# Patient Record
Sex: Male | Born: 1950 | Race: White | Hispanic: No | Marital: Married | Smoking: Never smoker
Health system: Southern US, Community
[De-identification: ages and names within clinical notes are randomized; demographics above are authoritative.]

## PROBLEM LIST (undated history)

## (undated) DIAGNOSIS — E119 Type 2 diabetes mellitus without complications: Secondary | ICD-10-CM

## (undated) DIAGNOSIS — I10 Essential (primary) hypertension: Secondary | ICD-10-CM

## (undated) DIAGNOSIS — E7800 Pure hypercholesterolemia, unspecified: Secondary | ICD-10-CM

---

## 2006-07-26 ENCOUNTER — Inpatient Hospital Stay (HOSPITAL_COMMUNITY): Admission: EM | Admit: 2006-07-26 | Discharge: 2006-08-06 | Payer: Self-pay | Admitting: Emergency Medicine

## 2007-06-16 ENCOUNTER — Emergency Department (HOSPITAL_COMMUNITY): Admission: EM | Admit: 2007-06-16 | Discharge: 2007-06-16 | Payer: Self-pay | Admitting: Emergency Medicine

## 2008-04-04 IMAGING — CR DG HUMERUS 2V *L*
2 series · 2 of 2 positions shown · non-contrast
Comparison: none

CLINICAL DATA: Fell from a tree.  
 LEFT SHOULDER - 3 VIEW:
 Three views of the left shoulder were obtained.  There is a comminuted fracture of the left mid clavicle, with bony over riding.  The glenohumeral joint appears normal.  A small bone fragment is noted along the inferior labrum, but this does not appear to represent an acute fracture fragment.  The AC joint is normally aligned.  Multiple left rib fractures are noted with left mid and upper lung contusion.

[t humerus ap left]
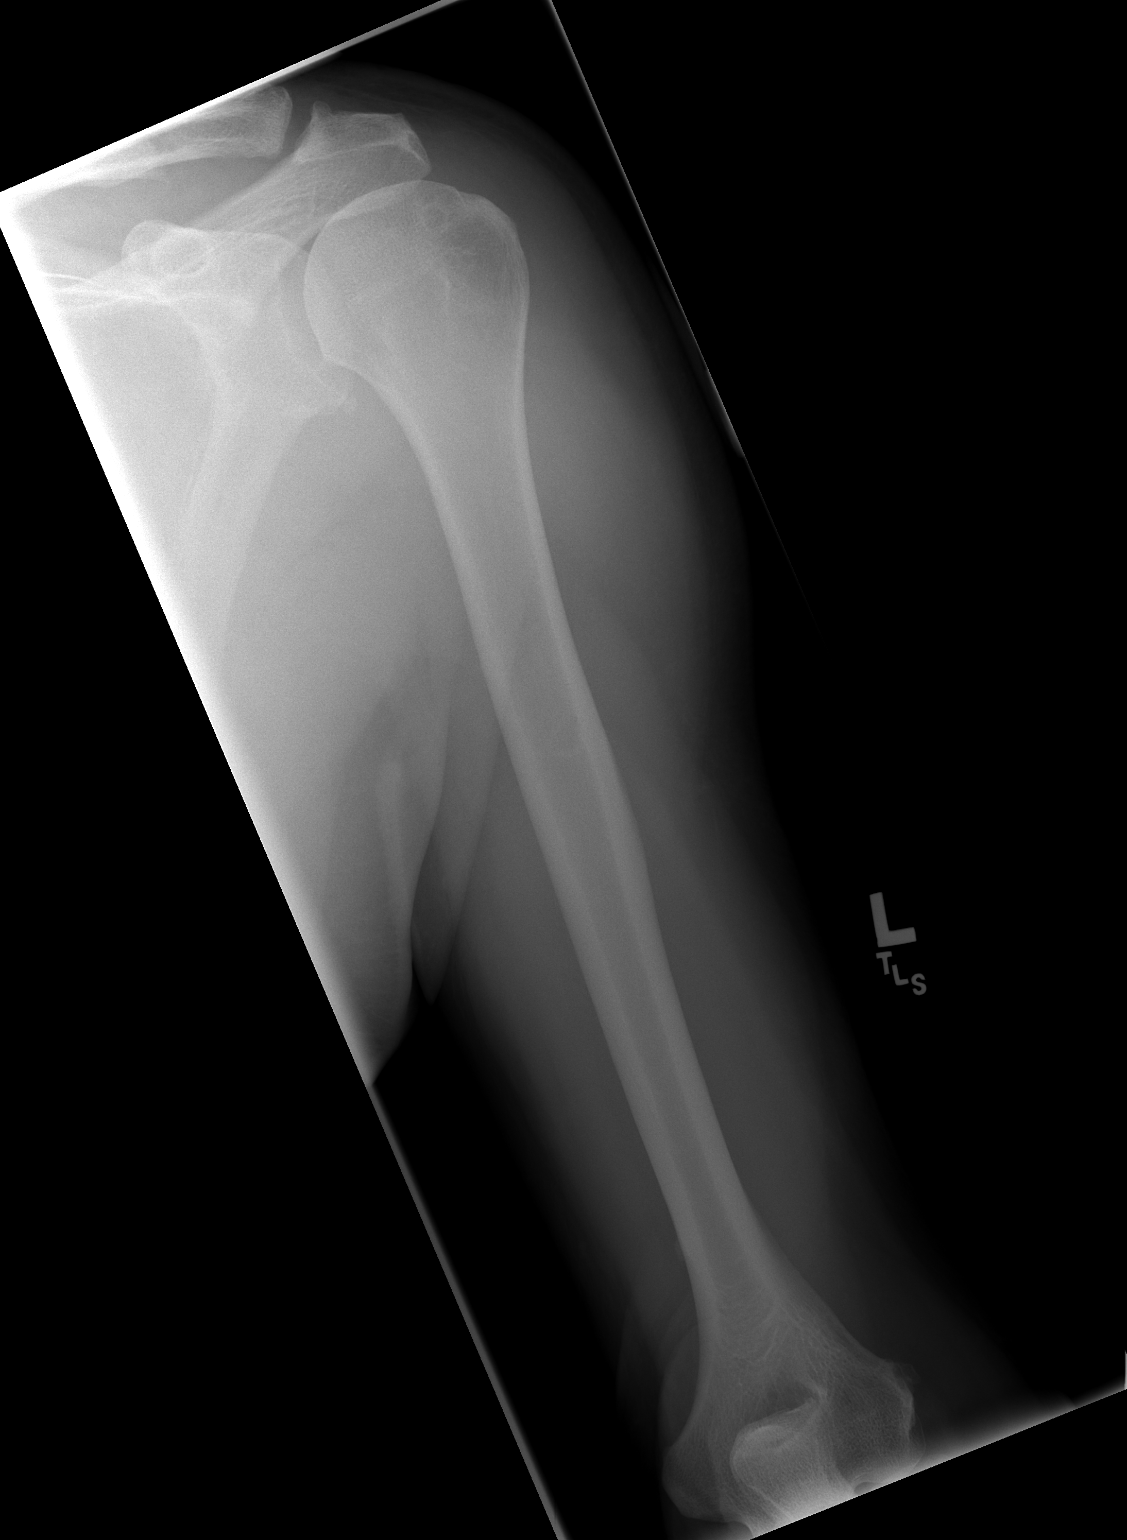

[t humerus lat left]
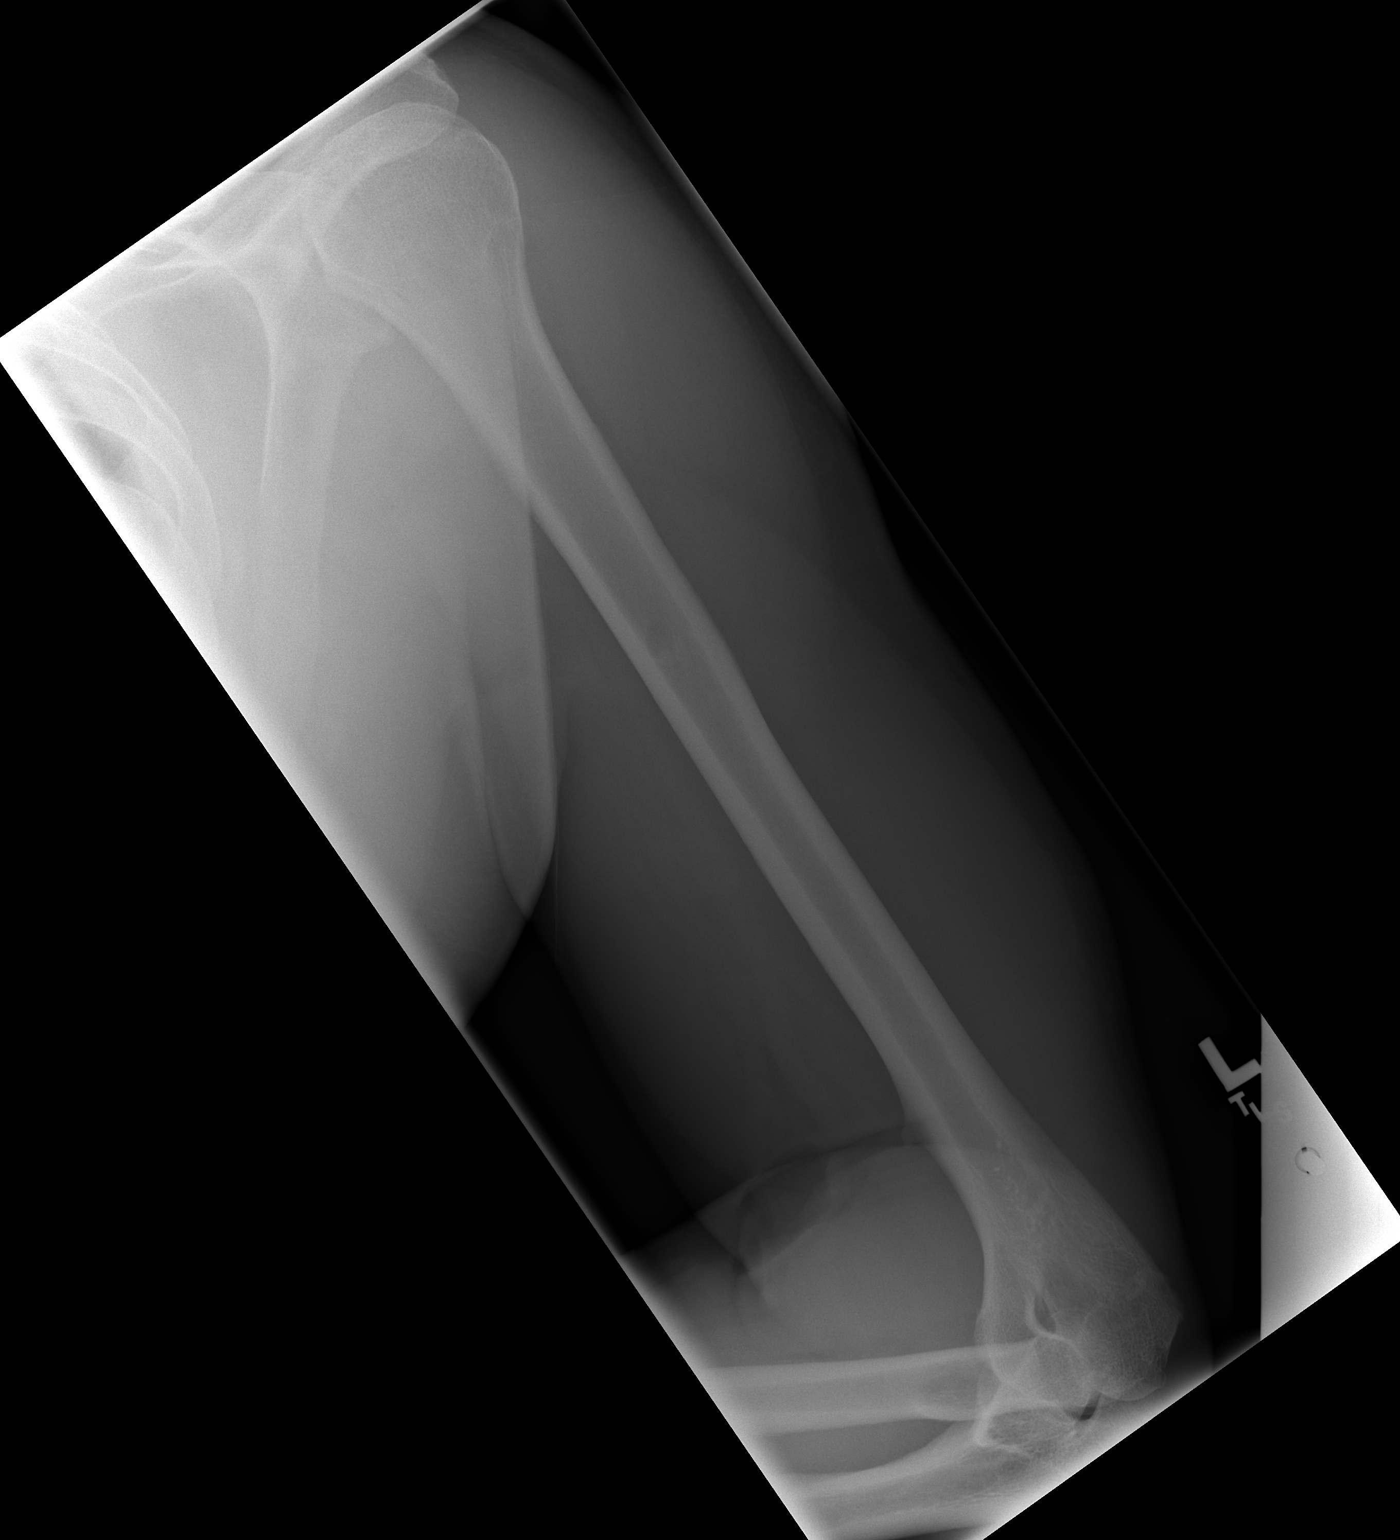

[2 of 2 positions shown; findings below may reference images not displayed]

IMPRESSION: 1.  Comminuted left mid clavicular fracture with bony over riding.
 2.  Multiple left rib fractures with left lung contusion.
 LEFT HUMERUS - 2 VIEW:
FINDINGS: Two views of the left humerus show no acute bony abnormality.  Alignment is normal.
IMPRESSION: Negative left humerus.

## 2008-04-04 IMAGING — CR DG SHOULDER 2+V*L*
3 series · 3 of 3 positions shown · non-contrast
Comparison: none

CLINICAL DATA: Fell from a tree.  
 LEFT SHOULDER - 3 VIEW:
 Three views of the left shoulder were obtained.  There is a comminuted fracture of the left mid clavicle, with bony over riding.  The glenohumeral joint appears normal.  A small bone fragment is noted along the inferior labrum, but this does not appear to represent an acute fracture fragment.  The AC joint is normally aligned.  Multiple left rib fractures are noted with left mid and upper lung contusion.

[t shoulder ap internal left]
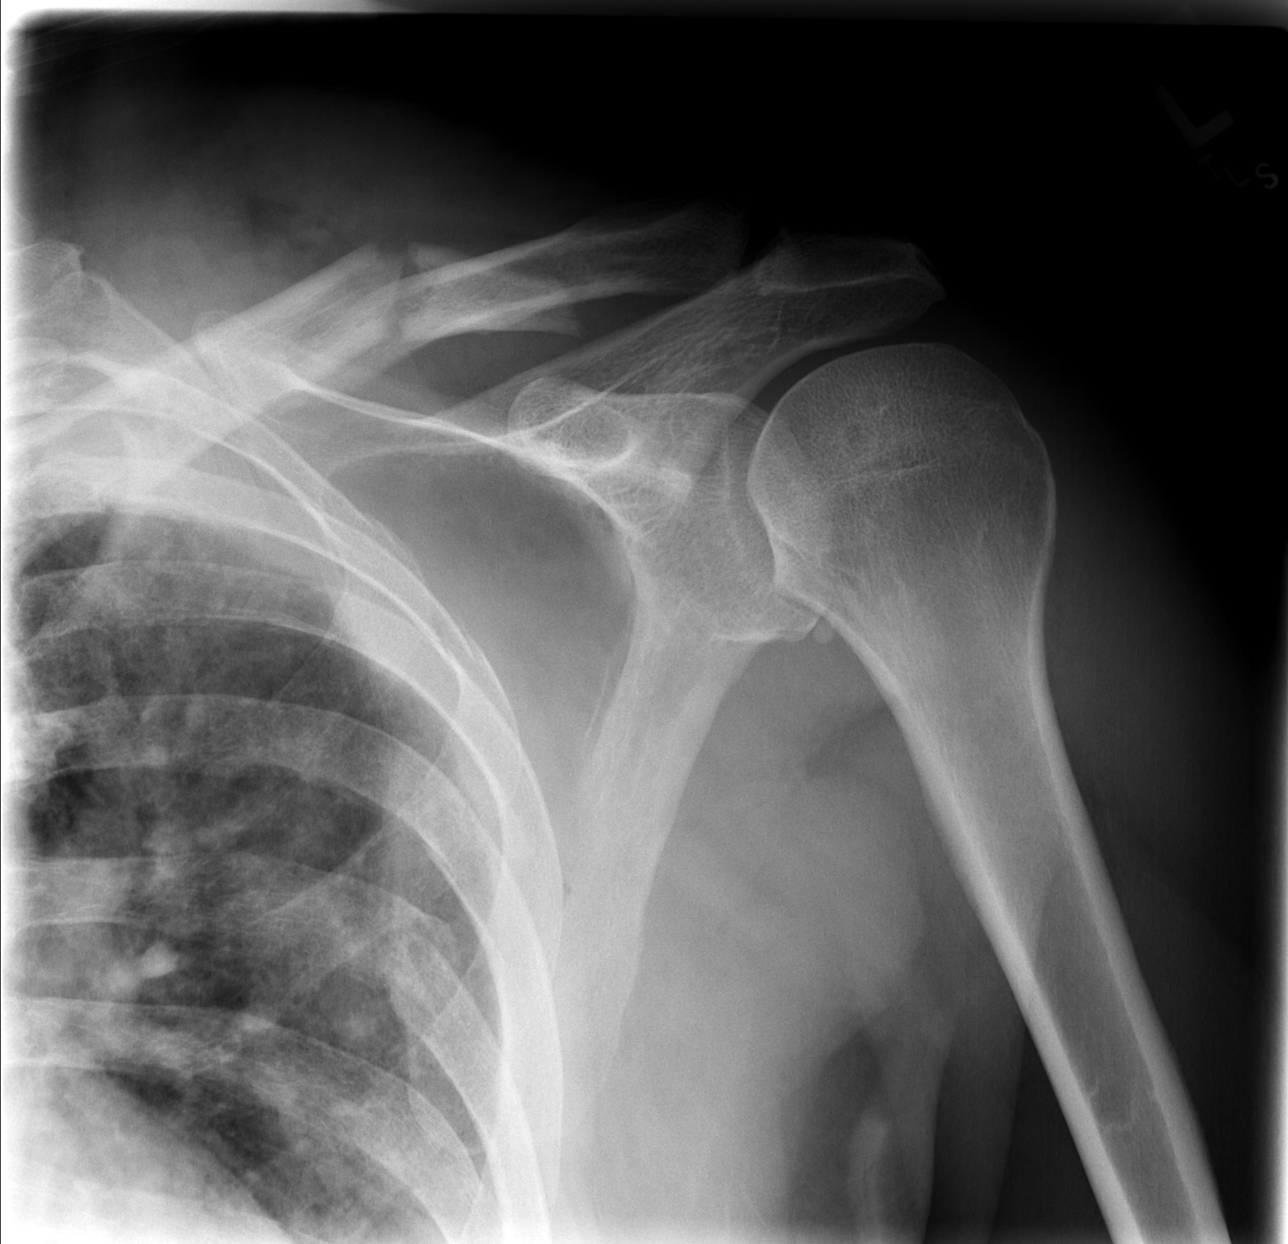

[t shoulder ap external left]
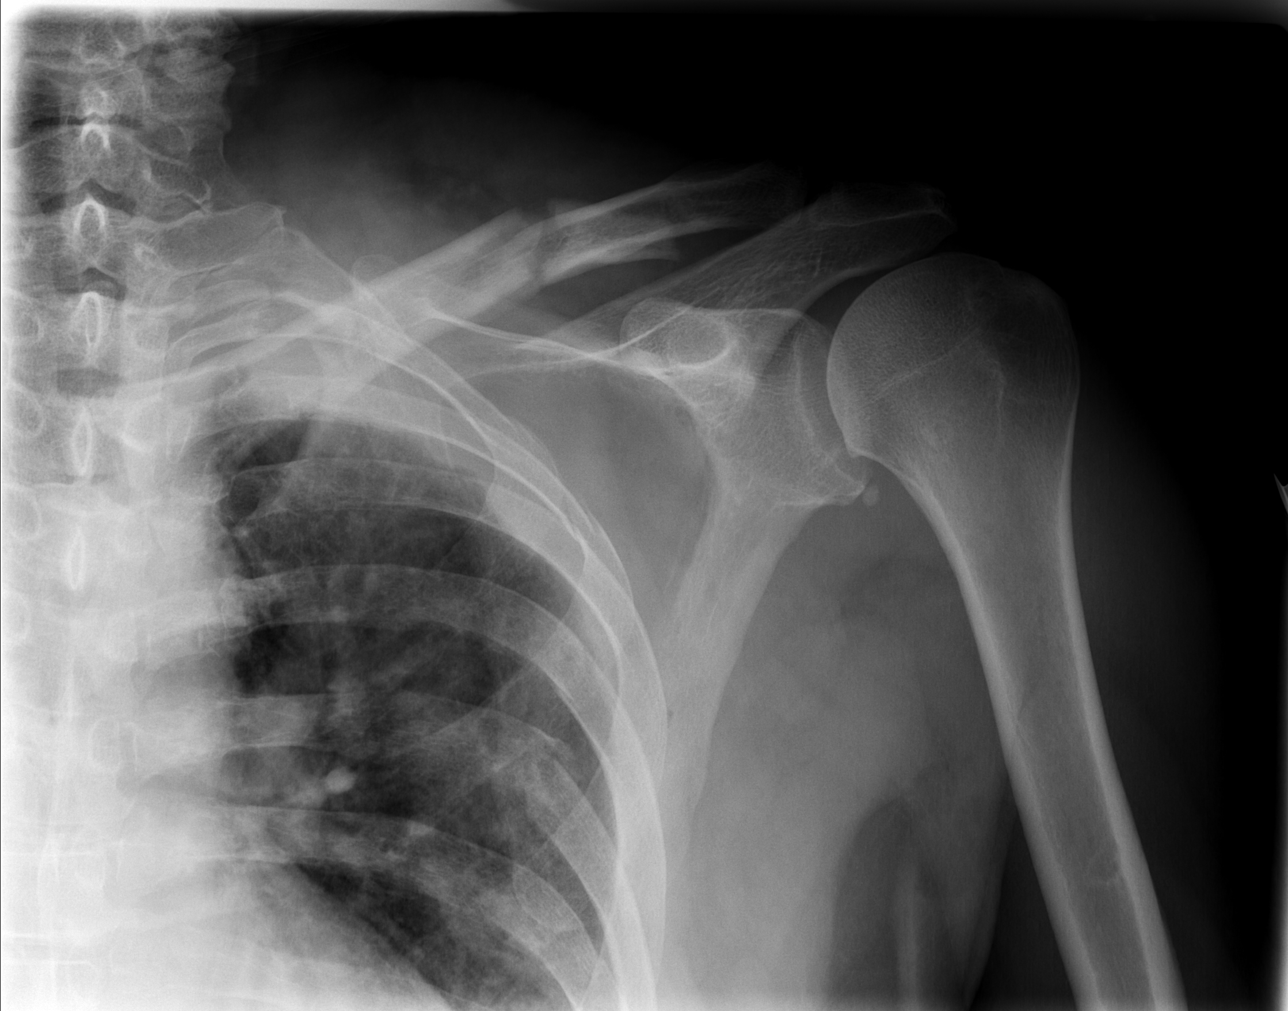

[t shoulder y view left]
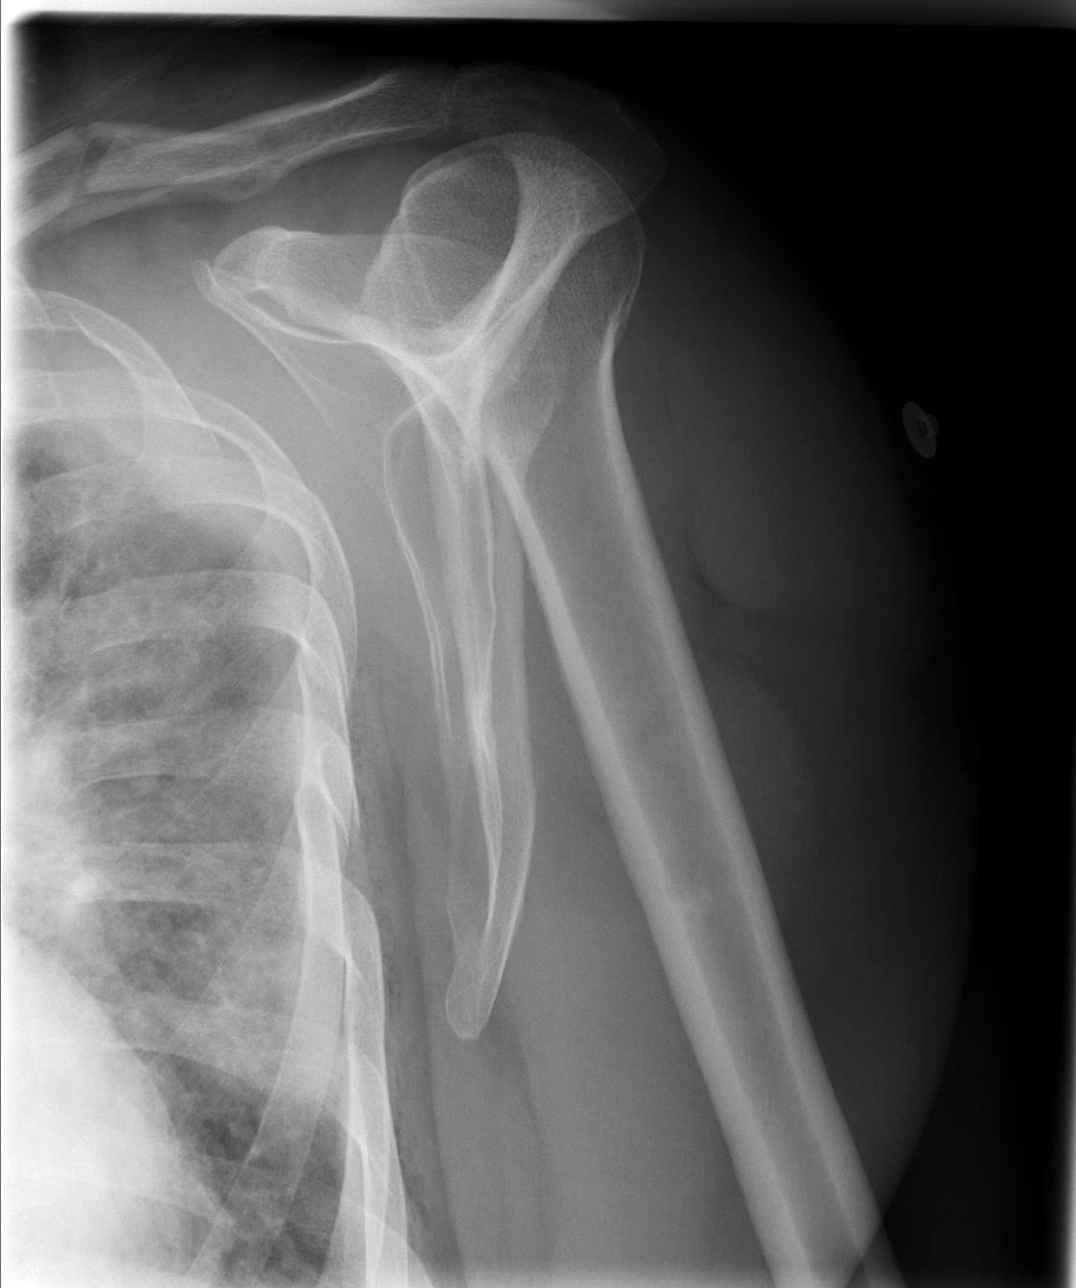

[3 of 3 positions shown; findings below may reference images not displayed]

IMPRESSION: 1.  Comminuted left mid clavicular fracture with bony over riding.
 2.  Multiple left rib fractures with left lung contusion.
 LEFT HUMERUS - 2 VIEW:
FINDINGS: Two views of the left humerus show no acute bony abnormality.  Alignment is normal.
IMPRESSION: Negative left humerus.

## 2010-12-07 NOTE — H&P (Signed)
Eric Sutton, Eric Sutton               ACCOUNT NO.:  [id number]   MEDICAL RECORD NO.:  [id number]          PATIENT TYPE:  EMS   LOCATION:  MAJO                         FACILITY:  [facility]   PHYSICIAN:  Sharlet Salina T. Hoxworth, M.D.DATE OF BIRTH:  01/29/1951   DATE OF ADMISSION:  07/26/2006  DATE OF DISCHARGE:                              HISTORY & PHYSICAL   CHIEF COMPLAINT:  Fall, left chest pain.   HISTORY OF PRESENT ILLNESS:  Eric Sutton is a 60 year old white male who  while building a tree house at his home fell out of a tree about 12-15  feet landing on his left side.  He had immediate severe left chest pain  and initially difficulty of breathing.  He was brought to [facility]  emergency room as a non trauma code for evaluation.  He is currently  complaining of diffuse left-sided chest pain and some mild shortness of  breath and pain around his left shoulder.  He denies any loss of  consciousness.  No abdominal pain or extremity pain other than his left  shoulder.   PAST MEDICAL HISTORY:   MEDICAL:  1. He is treated for mild adult onset diabetes mellitus.  2. Dyslipidemia.   SURGERY:  Significant only for a knee arthroscopy.   MEDICATIONS:  1. Amaryl 4 mg daily.  2. Lipitor 40 mg daily.   ALLERGIES:  CODEINE.   SOCIAL HISTORY:  He is married.  He has a Electronics engineer.  He does not  smoke cigarettes or drink alcohol.   FAMILY HISTORY:  Noncontributory.   REVIEW OF SYSTEMS:  Generally healthy.  All negative except as above.   PHYSICAL EXAMINATION:  VITAL SIGNS:  Temperature is 97.6, pulse 78,  respirations 18, blood pressure 131/71, O2 saturations 95% on 4 liters  nasal cannula.  GENERAL:  A well-developed white male in obvious pain.  SKIN:  Warm and dry.  No rash or infection.  HEENT:  No palpable masses or thyromegaly.  Sclerae nonicteric.  Neck is  nontender.  There is full active motion without pain.  Pupils equal,  round and reactive to light.  CHEST:  There is  tenderness diffusely along the left chest.  No  crepitus.  Breath sounds are equal with mild bilateral rhonchi.  No  increased work of breathing.  CARDIOVASCULAR:  Regular rate and rhythm.  No murmurs.  No JVD.  Peripheral pulses intact.  No edema.  ABDOMEN:  Flat, soft, nontender.  No bruising.  MUSCULOSKELETAL:  There is swelling and deformity over the left  clavicle.  Pain with motion of the left shoulder.  EXTREMITIES:  Otherwise atraumatic.  NEUROLOGIC:  Alert, orient.  Motor and sensory exams grossly normal.   LABORATORY AND X-RAYS:  Electrolytes unremarkable.  Glucose 151,  hemoglobin 14.3.  Urinalysis pending.  Imaging - extremity x-rays  include left shoulder showing a comminuted mid shaft left clavicle  fracture.  Left humerus x-ray negative.  CT scans of the head and C-  spine are negative.  CT scan of the chest shows fractures of ribs 1  through 7 with a left mid lung  contusion, small left pleural effusion  and tiny left pneumothorax.  Abdomen and pelvis x-ray negative except  for cholelithiasis.   ASSESSMENT/PLAN:  Fall with:  1. Multiple left rib fractures.  2. Pulmonary contusion.  3. A small left hemothorax.  4. A tiny left pneumothorax.  5. Clavicle fracture.  6. Adult onset diabetes mellitus.  7. Cholelithiasis, asymptomatic.   PLAN:  The patient will be admitted to the trauma service for close  observation, pain control and pulmonary toilet.      Eric Sutton. Hoxworth, M.D.  Electronically Signed     BTH/MEDQ  D:  07/26/2006  T:  07/26/2006  Job:  [id number]

## 2010-12-07 NOTE — Op Note (Signed)
NAMEERNST, CUMPSTON               ACCOUNT NO.:  [id number]   MEDICAL RECORD NO.:  [id number]          PATIENT TYPE:  INP   LOCATION:  3311                         FACILITY:  [facility]   PHYSICIAN:  Gabrielle Dare. Janee Morn, M.D.DATE OF BIRTH:  03/25/1951   DATE OF PROCEDURE:  07/29/2006  DATE OF DISCHARGE:                               OPERATIVE REPORT   PREOPERATIVE DIAGNOSES:  1. Multiple left rib fractures after fall.  2. Increasing left hemothorax.   POSTOPERATIVE DIAGNOSES:  1. Multiple left rib fractures after a fall.  2. Increasing left hemothorax.   PROCEDURE:  Insertion of left chest tube, 32 [place].   HISTORY OF PRESENT ILLNESS:  The patient is a 60 year old gentleman who  was admitted after a fall from a tree on July 26, 2006.  He has  multiple left rib fractures.  X-rays today show worsening hemothorax.  He was transferred to step-down unit and we are proceeding with left  chest tube insertion.   DESCRIPTION OF PROCEDURE:  Informed consent was obtained.  The patient  remains monitored in the 3300 unit throughout.  He received Versed and  fentanyl intravenously for conscious sedation.  His left chest was  prepped and draped in a sterile fashion; 1% lidocaine from the chest  tube tray was injected along the anterior axillary line at the nipple  level.  Transverse incision was made, subcutaneous tissues were  dissected down over the ribs.  Dissection continued over the next higher  rib and the chest cavity was entered with some rush of old blood.  A 32  French chest tube was then advanced into the thoracic cavity without  difficulty.  This was hooked to a  Pleur-evac and approximately 850 mL  of old blood was evacuated.  The chest tube was secured in place with 2  lengths of 0 silk suture and a sterile occlusive dressing was applied.  The patient tolerated the procedure well and a chest x-ray is pending.      Gabrielle Dare Janee Morn, M.D.  Electronically Signed     BET/MEDQ   D:  07/29/2006  T:  07/30/2006  Job:  [id number]

## 2010-12-07 NOTE — Discharge Summary (Signed)
NAMESAABIR, BLYTH               ACCOUNT NO.:  [id number]   MEDICAL RECORD NO.:  [id number]          PATIENT TYPE:  INP   LOCATION:  5033                         FACILITY:  [facility]   PHYSICIAN:  Sharlet Salina T. Hoxworth, M.D.DATE OF BIRTH:  1951/04/17   DATE OF ADMISSION:  07/26/2006  DATE OF DISCHARGE:  08/06/2006                               DISCHARGE SUMMARY   DISCHARGE DIAGNOSES:  1. Fall from tree.  2. Multiple left rib fractures x7.  3. Left hemopneumothorax.  4. Left clavicle fracture.  5. Diabetes mellitus.  6. Cholelithiasis.  7. Dyslipidemia.   CONSULTANTS:  Dr. Charlann Boxer for orthopedic surgery.   PROCEDURES:  Left tube thoracostomy.   HISTORY OF PRESENT ILLNESS:  This is a 60 year old white male who was  building a tree house for his son when he fell out of a tree onto his  left side.  Fell approximately 12-15 feet.  Comes in as a non-trauma  code complaining of significant left-sided chest pain.   Workup demonstrated multiple left-sided rib fractures and a left  clavicle fracture.   He was admitted for pain control and orthopedic consultation.   HOSPITAL COURSE:  The patient did fairly well from a respiratory  standpoint while in the hospital.  However, a couple of days after  admission, he developed worsening left-sided hemothorax, and a left tube  thoracostomy was necessary.  We attempted a thoracic epidural for pain  control, but it did not help.  Pain control was an issue while he was  here, but we eventually got it controlled with a mixture of OxyContin  and Norco.  He had some issues with adverse drug reactions at night  because of narcotics, and they had to be backed off in the evening, but  eventually we got those adjusted so that he had adequate pain control  without significant adverse reactions.  The chest tube was able to put  to waterseal quickly but took awhile for the discharge to taper off.  Once it did, we were able to take it out without significant  problems,  and he was able to go home later that same day in good condition in the  care of his family.  It was thought that his clavicle fracture going to  be treated nonoperatively, although the orthopedic surgeon was reserving  final decision on that until he saw the patient back in the office.  The  patient's diabetes did seem to be poorly controlled probably prior to  this accident, and he was started on some metformin in addition to the  Amaryl he came in on.  He still was having elevated blood sugars by the  time of discharge.   DISCHARGE MEDICATIONS:  1. OxyContin 20-mg tablets, take 2 p.o. q.a.m., 1 p.o. q.p.m., #45, no      refill.  2. Norco 10/325, take 1 to 2 p.o. q.4 h. p.r.n. pain, #60 with 1      refill.  3. Glucophage 500-mg tablets, take 2 p.o. q.a.m., 1 p.o. q.p.m., #90      with no refill.   In addition, he is to  resume all of his home medications.   FOLLOW UP:  The patient is to follow-up with Dr. Charlann Boxer in approximately  10 days.  He is to follow-up with his primary care Anela Bensman within the  next 10 days.  He may call the trauma service for any questions or  concerns.      Earney Hamburg, P.A.      Lorne Skeens. Hoxworth, M.D.  Electronically Signed    MJ/MEDQ  D:  08/06/2006  T:  08/07/2006  Job:  [id number]   cc:   Madlyn Frankel Charlann Boxer, M.D.

## 2011-04-30 LAB — URINE MICROSCOPIC-ADD ON

## 2011-04-30 LAB — COMPREHENSIVE METABOLIC PANEL
ALT: 55 — ABNORMAL HIGH
AST: 44 — ABNORMAL HIGH
Albumin: 3.9
Alkaline Phosphatase: 62
BUN: 18
Chloride: 99
Potassium: 4.2
Sodium: 141
Total Bilirubin: 0.7

## 2011-04-30 LAB — DIFFERENTIAL
Basophils Absolute: 0
Basophils Relative: 0
Eosinophils Absolute: 0.1 — ABNORMAL LOW
Eosinophils Relative: 1
Monocytes Absolute: 0.8
Monocytes Relative: 6
Neutro Abs: 9.1 — ABNORMAL HIGH

## 2011-04-30 LAB — URINALYSIS, ROUTINE W REFLEX MICROSCOPIC
Glucose, UA: 100 — AB
Ketones, ur: NEGATIVE
Protein, ur: NEGATIVE
pH: 8

## 2011-04-30 LAB — CBC
HCT: 39.1
Platelets: 270
WBC: 12 — ABNORMAL HIGH

## 2016-07-27 DIAGNOSIS — E039 Hypothyroidism, unspecified: Secondary | ICD-10-CM | POA: Diagnosis not present

## 2016-07-27 DIAGNOSIS — E119 Type 2 diabetes mellitus without complications: Secondary | ICD-10-CM | POA: Diagnosis not present

## 2016-07-27 DIAGNOSIS — B9689 Other specified bacterial agents as the cause of diseases classified elsewhere: Secondary | ICD-10-CM | POA: Diagnosis not present

## 2016-07-27 DIAGNOSIS — R319 Hematuria, unspecified: Secondary | ICD-10-CM | POA: Diagnosis not present

## 2016-07-27 DIAGNOSIS — Z885 Allergy status to narcotic agent status: Secondary | ICD-10-CM | POA: Diagnosis not present

## 2016-07-27 DIAGNOSIS — R9341 Abnormal radiologic findings on diagnostic imaging of renal pelvis, ureter, or bladder: Secondary | ICD-10-CM | POA: Diagnosis not present

## 2016-07-27 DIAGNOSIS — E785 Hyperlipidemia, unspecified: Secondary | ICD-10-CM | POA: Diagnosis not present

## 2016-07-27 DIAGNOSIS — I723 Aneurysm of iliac artery: Secondary | ICD-10-CM | POA: Diagnosis not present

## 2016-07-27 DIAGNOSIS — Z79899 Other long term (current) drug therapy: Secondary | ICD-10-CM | POA: Diagnosis not present

## 2016-07-27 DIAGNOSIS — N390 Urinary tract infection, site not specified: Secondary | ICD-10-CM | POA: Diagnosis not present

## 2016-07-27 DIAGNOSIS — I10 Essential (primary) hypertension: Secondary | ICD-10-CM | POA: Diagnosis not present

## 2016-12-26 DIAGNOSIS — Z0000 Encounter for general adult medical examination without abnormal findings: Secondary | ICD-10-CM | POA: Diagnosis not present

## 2016-12-26 DIAGNOSIS — F4320 Adjustment disorder, unspecified: Secondary | ICD-10-CM | POA: Diagnosis not present

## 2016-12-26 DIAGNOSIS — Z1159 Encounter for screening for other viral diseases: Secondary | ICD-10-CM | POA: Diagnosis not present

## 2016-12-26 DIAGNOSIS — E119 Type 2 diabetes mellitus without complications: Secondary | ICD-10-CM | POA: Diagnosis not present

## 2016-12-26 DIAGNOSIS — G4700 Insomnia, unspecified: Secondary | ICD-10-CM | POA: Diagnosis not present

## 2016-12-26 DIAGNOSIS — M25561 Pain in right knee: Secondary | ICD-10-CM | POA: Diagnosis not present

## 2016-12-26 DIAGNOSIS — Z23 Encounter for immunization: Secondary | ICD-10-CM | POA: Diagnosis not present

## 2016-12-26 DIAGNOSIS — I10 Essential (primary) hypertension: Secondary | ICD-10-CM | POA: Diagnosis not present

## 2016-12-26 DIAGNOSIS — Z125 Encounter for screening for malignant neoplasm of prostate: Secondary | ICD-10-CM | POA: Diagnosis not present

## 2017-01-20 DIAGNOSIS — R972 Elevated prostate specific antigen [PSA]: Secondary | ICD-10-CM | POA: Diagnosis not present

## 2017-05-13 DIAGNOSIS — Z23 Encounter for immunization: Secondary | ICD-10-CM | POA: Diagnosis not present

## 2017-05-13 DIAGNOSIS — R05 Cough: Secondary | ICD-10-CM | POA: Diagnosis not present

## 2017-05-13 DIAGNOSIS — J0100 Acute maxillary sinusitis, unspecified: Secondary | ICD-10-CM | POA: Diagnosis not present

## 2017-06-23 DIAGNOSIS — E782 Mixed hyperlipidemia: Secondary | ICD-10-CM | POA: Diagnosis not present

## 2017-06-23 DIAGNOSIS — I10 Essential (primary) hypertension: Secondary | ICD-10-CM | POA: Diagnosis not present

## 2017-06-23 DIAGNOSIS — E039 Hypothyroidism, unspecified: Secondary | ICD-10-CM | POA: Diagnosis not present

## 2017-06-23 DIAGNOSIS — E119 Type 2 diabetes mellitus without complications: Secondary | ICD-10-CM | POA: Diagnosis not present

## 2017-06-23 DIAGNOSIS — G4700 Insomnia, unspecified: Secondary | ICD-10-CM | POA: Diagnosis not present

## 2017-06-23 DIAGNOSIS — N529 Male erectile dysfunction, unspecified: Secondary | ICD-10-CM | POA: Diagnosis not present

## 2017-07-02 DIAGNOSIS — E109 Type 1 diabetes mellitus without complications: Secondary | ICD-10-CM | POA: Diagnosis not present

## 2017-07-02 DIAGNOSIS — E7800 Pure hypercholesterolemia, unspecified: Secondary | ICD-10-CM | POA: Diagnosis not present

## 2017-07-02 DIAGNOSIS — I10 Essential (primary) hypertension: Secondary | ICD-10-CM | POA: Diagnosis not present

## 2017-07-02 DIAGNOSIS — Z0100 Encounter for examination of eyes and vision without abnormal findings: Secondary | ICD-10-CM | POA: Diagnosis not present

## 2018-02-27 DIAGNOSIS — Z7984 Long term (current) use of oral hypoglycemic drugs: Secondary | ICD-10-CM | POA: Diagnosis not present

## 2018-02-27 DIAGNOSIS — I10 Essential (primary) hypertension: Secondary | ICD-10-CM | POA: Diagnosis not present

## 2018-02-27 DIAGNOSIS — Z0000 Encounter for general adult medical examination without abnormal findings: Secondary | ICD-10-CM | POA: Diagnosis not present

## 2018-02-27 DIAGNOSIS — E782 Mixed hyperlipidemia: Secondary | ICD-10-CM | POA: Diagnosis not present

## 2018-02-27 DIAGNOSIS — E1159 Type 2 diabetes mellitus with other circulatory complications: Secondary | ICD-10-CM | POA: Diagnosis not present

## 2018-02-27 DIAGNOSIS — Z1211 Encounter for screening for malignant neoplasm of colon: Secondary | ICD-10-CM | POA: Diagnosis not present

## 2018-02-27 DIAGNOSIS — G4700 Insomnia, unspecified: Secondary | ICD-10-CM | POA: Diagnosis not present

## 2018-02-27 DIAGNOSIS — N529 Male erectile dysfunction, unspecified: Secondary | ICD-10-CM | POA: Diagnosis not present

## 2018-02-27 DIAGNOSIS — E039 Hypothyroidism, unspecified: Secondary | ICD-10-CM | POA: Diagnosis not present

## 2018-03-06 DIAGNOSIS — Z1211 Encounter for screening for malignant neoplasm of colon: Secondary | ICD-10-CM | POA: Diagnosis not present

## 2018-04-08 ENCOUNTER — Emergency Department (HOSPITAL_COMMUNITY): Payer: Medicare HMO

## 2018-04-08 ENCOUNTER — Encounter (HOSPITAL_COMMUNITY): Payer: Self-pay | Admitting: Emergency Medicine

## 2018-04-08 ENCOUNTER — Other Ambulatory Visit: Payer: Self-pay

## 2018-04-08 ENCOUNTER — Emergency Department (HOSPITAL_COMMUNITY)
Admission: EM | Admit: 2018-04-08 | Discharge: 2018-04-08 | Disposition: A | Discharge status: Home / Self Care | Payer: Medicare HMO | Attending: Emergency Medicine | Admitting: Emergency Medicine

## 2018-04-08 DIAGNOSIS — E119 Type 2 diabetes mellitus without complications: Secondary | ICD-10-CM | POA: Diagnosis not present

## 2018-04-08 DIAGNOSIS — R4781 Slurred speech: Secondary | ICD-10-CM | POA: Diagnosis not present

## 2018-04-08 DIAGNOSIS — Z79899 Other long term (current) drug therapy: Secondary | ICD-10-CM | POA: Insufficient documentation

## 2018-04-08 DIAGNOSIS — I10 Essential (primary) hypertension: Secondary | ICD-10-CM | POA: Insufficient documentation

## 2018-04-08 DIAGNOSIS — R200 Anesthesia of skin: Secondary | ICD-10-CM | POA: Diagnosis not present

## 2018-04-08 DIAGNOSIS — Z7984 Long term (current) use of oral hypoglycemic drugs: Secondary | ICD-10-CM | POA: Diagnosis not present

## 2018-04-08 DIAGNOSIS — R531 Weakness: Secondary | ICD-10-CM | POA: Diagnosis not present

## 2018-04-08 DIAGNOSIS — I639 Cerebral infarction, unspecified: Secondary | ICD-10-CM | POA: Diagnosis not present

## 2018-04-08 DIAGNOSIS — Z7982 Long term (current) use of aspirin: Secondary | ICD-10-CM | POA: Diagnosis not present

## 2018-04-08 HISTORY — DX: Pure hypercholesterolemia, unspecified: E78.00

## 2018-04-08 HISTORY — DX: Type 2 diabetes mellitus without complications: E11.9

## 2018-04-08 HISTORY — DX: Essential (primary) hypertension: I10

## 2018-04-08 LAB — DIFFERENTIAL
Abs Immature Granulocytes: 0 10*3/uL (ref 0.0–0.1)
BASOS ABS: 0.1 10*3/uL (ref 0.0–0.1)
Basophils Relative: 1 %
Eosinophils Absolute: 0.1 10*3/uL (ref 0.0–0.7)
Eosinophils Relative: 1 %
IMMATURE GRANULOCYTES: 0 %
LYMPHS ABS: 1.6 10*3/uL (ref 0.7–4.0)
LYMPHS PCT: 23 %
Monocytes Absolute: 0.6 10*3/uL (ref 0.1–1.0)
Monocytes Relative: 8 %
NEUTROS ABS: 4.8 10*3/uL (ref 1.7–7.7)
Neutrophils Relative %: 67 %

## 2018-04-08 LAB — I-STAT TROPONIN, ED: Troponin i, poc: 0 ng/mL (ref 0.00–0.08)

## 2018-04-08 LAB — CBC
HEMATOCRIT: 46.1 % (ref 39.0–52.0)
Hemoglobin: 14.7 g/dL (ref 13.0–17.0)
MCH: 31.3 pg (ref 26.0–34.0)
MCHC: 31.9 g/dL (ref 30.0–36.0)
MCV: 98.3 fL (ref 78.0–100.0)
Platelets: 261 10*3/uL (ref 150–400)
RBC: 4.69 MIL/uL (ref 4.22–5.81)
RDW: 13.5 % (ref 11.5–15.5)
WBC: 7.2 10*3/uL (ref 4.0–10.5)

## 2018-04-08 LAB — COMPREHENSIVE METABOLIC PANEL
ALBUMIN: 4 g/dL (ref 3.5–5.0)
ALK PHOS: 53 U/L (ref 38–126)
ALT: 25 U/L (ref 0–44)
AST: 28 U/L (ref 15–41)
Anion gap: 13 (ref 5–15)
BILIRUBIN TOTAL: 0.9 mg/dL (ref 0.3–1.2)
BUN: 15 mg/dL (ref 8–23)
CALCIUM: 9.6 mg/dL (ref 8.9–10.3)
CO2: 23 mmol/L (ref 22–32)
Chloride: 100 mmol/L (ref 98–111)
Creatinine, Ser: 1.29 mg/dL — ABNORMAL HIGH (ref 0.61–1.24)
GFR calc Af Amer: >60 mL/min (ref >60)
GFR calc non Af Amer: 56 mL/min — ABNORMAL LOW (ref >60)
GLUCOSE: 249 mg/dL — AB (ref 70–99)
Potassium: 4.3 mmol/L (ref 3.5–5.1)
SODIUM: 136 mmol/L (ref 135–145)
Total Protein: 7.6 g/dL (ref 6.5–8.1)

## 2018-04-08 LAB — I-STAT CHEM 8, ED
BUN: 17 mg/dL (ref 8–23)
CHLORIDE: 100 mmol/L (ref 98–111)
Calcium, Ion: 1.15 mmol/L (ref 1.15–1.40)
Creatinine, Ser: 1.2 mg/dL (ref 0.61–1.24)
Glucose, Bld: 252 mg/dL — ABNORMAL HIGH (ref 70–99)
HEMATOCRIT: 46 % (ref 39.0–52.0)
HEMOGLOBIN: 15.6 g/dL (ref 13.0–17.0)
POTASSIUM: 4.1 mmol/L (ref 3.5–5.1)
Sodium: 137 mmol/L (ref 135–145)
TCO2: 25 mmol/L (ref 22–32)

## 2018-04-08 LAB — PROTIME-INR
INR: 0.95
Prothrombin Time: 12.5 seconds (ref 11.4–15.2)

## 2018-04-08 LAB — APTT: APTT: 35 s (ref 24–36)

## 2018-04-08 LAB — CBG MONITORING, ED: GLUCOSE-CAPILLARY: 199 mg/dL — AB (ref 70–99)

## 2018-04-08 MED ORDER — GADOBUTROL 1 MMOL/ML IV SOLN
10.0000 mL | Freq: Once | INTRAVENOUS | Status: AC | PRN
  Administered 2018-04-08: 10 mL via INTRAVENOUS

## 2018-04-08 MED ORDER — LORAZEPAM 2 MG/ML IJ SOLN
1.0000 mg | Freq: Once | INTRAMUSCULAR | Status: AC
  Administered 2018-04-08: 1 mg via INTRAVENOUS
  Filled 2018-04-08: qty 1

## 2018-04-08 NOTE — ED Notes (Signed)
Patient in MRI 

## 2018-04-08 NOTE — ED Notes (Signed)
Pt back from MRI 

## 2018-04-08 NOTE — ED Notes (Signed)
Patient still in MRI.  

## 2018-04-08 NOTE — ED Triage Notes (Signed)
Pt c/o slurred speech started 2 days ago - recently started smoking marijuana again- numbness in right hand for approx 1 week. Speech clear now- wife also said pt had memory lapse on Monday- got lost in neighborhood- On monday morning, ate 2 candy bars and no breakfast prior to this happening.

## 2018-04-08 NOTE — ED Notes (Signed)
CBG was 199. Pt tolerated well.

## 2018-04-08 NOTE — ED Notes (Signed)
Patient verbalizes understanding of discharge instructions. Opportunity for questioning and answers were provided. Armband removed by staff, pt discharged from ED ambulatory.   

## 2018-04-11 NOTE — ED Provider Notes (Signed)
[facility] EMERGENCY DEPARTMENT Provider Note   CSN: [id number] Arrival date & time: 04/08/18  0848     History   Chief Complaint Chief Complaint  Patient presents with  . Stroke Symptoms    HPI Eric Sutton is a 67 y.o. male.  HPI Patient is a 67 year old male with no prior history of stroke who reportedly had some transient slurred speech 2 days ago.  He now presents the emergency department with paresthesias and numbness of his right hand and right upper extremity for 1 week.  Wife reports his speech is normal now.  He denies weakness of his arms or legs.  He denies clumsy activity with his right hand.  He states he is still able to button his shirt.  He denies headache at this time.  He denies weakness of his arms and legs.  He reports his gait is normal.  Symptoms are mild.   Past Medical History:  Diagnosis Date  . Diabetes mellitus without complication (HCC)   . High cholesterol   . Hypertension     There are no active problems to display for this patient.   History reviewed. No pertinent surgical history.      Home Medications    Prior to Admission medications   Medication Sig Start Date End Date Taking? Authorizing Provider  aspirin EC 81 MG tablet Take 81 mg by mouth daily.   Yes [provider]  glimepiride (AMARYL) 4 MG tablet Take 8 mg by mouth daily.   Yes [provider]  levothyroxine (SYNTHROID, LEVOTHROID) 75 MCG tablet Take 75 mcg by mouth daily.   Yes [provider]  lisinopril (PRINIVIL,ZESTRIL) 10 MG tablet Take 10 mg by mouth daily.   Yes [provider]  metFORMIN (GLUCOPHAGE) 500 MG tablet Take 1,000 mg by mouth 2 (two) times daily with a meal.    Yes [provider]  omega-3 acid ethyl esters (LOVAZA) 1 g capsule Take 1 g by mouth daily.   Yes [provider]  PIOGLITAZONE HCL PO Take 1 mg by mouth daily.    Yes [provider]  simvastatin (ZOCOR) 40 MG  tablet Take 40 mg by mouth at bedtime.   Yes [provider]  Cyanocobalamin (VITAMIN B-12) 5000 MCG TBDP Take 5,000 mg by mouth daily.    [provider]    Family History No family history on file.  Social History Social History   Tobacco Use  . Smoking status: Never Smoker  . Smokeless tobacco: Never Used  Substance Use Topics  . Alcohol use: Never    Frequency: Never  . Drug use: Yes    Types: Marijuana     Allergies   Codeine   Review of Systems Review of Systems  All other systems reviewed and are negative.    Physical Exam Updated Vital Signs BP (!) 150/82   Pulse 70   Temp 98.9 F (37.2 C) (Oral)   Resp 17   Ht 6\' 1"  (1.854 m)   Wt 93.9 kg   SpO2 96%   BMI 27.31 kg/m   Physical Exam  Constitutional: He is oriented to person, place, and time. He appears well-developed and well-nourished.  HENT:  Head: Normocephalic and atraumatic.  Eyes: Pupils are equal, round, and reactive to light. EOM are normal.  Neck: Normal range of motion.  Cardiovascular: Normal rate, regular rhythm and normal heart sounds.  Pulmonary/Chest: Effort normal and breath sounds normal. No respiratory distress.  Abdominal: Soft.  He exhibits no distension. There is no tenderness.  Musculoskeletal: Normal range of motion.  Neurological: He is alert and oriented to person, place, and time.  5/5 strength in major muscle groups of  bilateral upper and lower extremities. Speech normal. No facial asymetry.   Skin: Skin is warm and dry.  Psychiatric: He has a normal mood and affect. Judgment normal.  Nursing note and vitals reviewed.    ED Treatments / Results  Labs (all labs ordered are listed, but only abnormal results are displayed) Labs Reviewed  COMPREHENSIVE METABOLIC PANEL - Abnormal; Notable for the following components:      Result Value   Glucose, Bld 249 (*)    Creatinine, Ser 1.29 (*)    GFR calc non Af Amer 56 (*)    All other components within  normal limits  CBG MONITORING, ED - Abnormal; Notable for the following components:   Glucose-Capillary 199 (*)    All other components within normal limits  I-STAT CHEM 8, ED - Abnormal; Notable for the following components:   Glucose, Bld 252 (*)    All other components within normal limits  PROTIME-INR  APTT  CBC  DIFFERENTIAL  I-STAT TROPONIN, ED    EKG EKG Interpretation  Date/Time:  Wednesday April 08 2018 08:57:10 EDT Ventricular Rate:  82 PR Interval:  170 QRS Duration: 84 QT Interval:  370 QTC Calculation: 432 R Axis:   70 Text Interpretation:  Normal sinus rhythm Possible Left atrial enlargement Borderline ECG Confirmed by Benjiman CorePickering, Nathan [phone]) on 04/09/2018 8:15:23 PM   Radiology Mri brain - no acute ischemia found  Procedures Procedures (including critical care time)  Medications Ordered in ED Medications  LORazepam (ATIVAN) injection 1 mg (1 mg Intravenous Given 04/08/18 1228)  gadobutrol (GADAVIST) 1 MMOL/ML injection 10 mL (10 mLs Intravenous Contrast Given 04/08/18 1406)     Initial Impression / Assessment and Plan / ED Course  I have reviewed the triage vital signs and the nursing notes.  Pertinent labs & imaging results that were available during my care of the patient were reviewed by me and considered in my medical decision making (see chart for details).     Patient underwent MRI imaging in the emergency department demonstrated no acute neurologic insult.  He is on 81 mg aspirin.  He will be referred to outpatient neurology and primary care.  He may need to complete a TIA work-up with echo and bilateral carotid duplexes as an outpatient.  At this time he is otherwise asymptomatic I do not think he needs further work-up in the hospital this time.  He continues to have active symptoms in his right upper extremity and therefore this may represent more of a radicular type issue.  Primary care follow-up.  MRI of his spine if his symptoms do not  improve.  This can be completed as an outpatient.  Patient is encouraged to return to the emergency department for new or worsening symptoms  Final Clinical Impressions(s) / ED Diagnoses   Final diagnoses:  Right upper extremity numbness    ED Discharge Orders    None       Azalia Bilisampos, Garner Dullea, MD 04/11/18 2126

## 2018-12-21 DIAGNOSIS — I10 Essential (primary) hypertension: Secondary | ICD-10-CM | POA: Diagnosis not present

## 2018-12-21 DIAGNOSIS — N529 Male erectile dysfunction, unspecified: Secondary | ICD-10-CM | POA: Diagnosis not present

## 2018-12-21 DIAGNOSIS — E039 Hypothyroidism, unspecified: Secondary | ICD-10-CM | POA: Diagnosis not present

## 2018-12-21 DIAGNOSIS — E782 Mixed hyperlipidemia: Secondary | ICD-10-CM | POA: Diagnosis not present

## 2018-12-21 DIAGNOSIS — E1159 Type 2 diabetes mellitus with other circulatory complications: Secondary | ICD-10-CM | POA: Diagnosis not present

## 2018-12-21 DIAGNOSIS — G4700 Insomnia, unspecified: Secondary | ICD-10-CM | POA: Diagnosis not present

## 2019-01-14 DIAGNOSIS — E1159 Type 2 diabetes mellitus with other circulatory complications: Secondary | ICD-10-CM | POA: Diagnosis not present

## 2019-01-14 DIAGNOSIS — E782 Mixed hyperlipidemia: Secondary | ICD-10-CM | POA: Diagnosis not present

## 2019-07-26 DIAGNOSIS — Z0000 Encounter for general adult medical examination without abnormal findings: Secondary | ICD-10-CM | POA: Diagnosis not present

## 2019-07-26 DIAGNOSIS — I10 Essential (primary) hypertension: Secondary | ICD-10-CM | POA: Diagnosis not present

## 2019-07-26 DIAGNOSIS — G4700 Insomnia, unspecified: Secondary | ICD-10-CM | POA: Diagnosis not present

## 2019-07-26 DIAGNOSIS — E1159 Type 2 diabetes mellitus with other circulatory complications: Secondary | ICD-10-CM | POA: Diagnosis not present

## 2019-07-26 DIAGNOSIS — N529 Male erectile dysfunction, unspecified: Secondary | ICD-10-CM | POA: Diagnosis not present

## 2019-07-26 DIAGNOSIS — E1169 Type 2 diabetes mellitus with other specified complication: Secondary | ICD-10-CM | POA: Diagnosis not present

## 2019-07-26 DIAGNOSIS — E782 Mixed hyperlipidemia: Secondary | ICD-10-CM | POA: Diagnosis not present

## 2019-07-26 DIAGNOSIS — Z1211 Encounter for screening for malignant neoplasm of colon: Secondary | ICD-10-CM | POA: Diagnosis not present

## 2019-07-26 DIAGNOSIS — E039 Hypothyroidism, unspecified: Secondary | ICD-10-CM | POA: Diagnosis not present

## 2019-08-11 DIAGNOSIS — Z1211 Encounter for screening for malignant neoplasm of colon: Secondary | ICD-10-CM | POA: Diagnosis not present

## 2020-03-01 DIAGNOSIS — N529 Male erectile dysfunction, unspecified: Secondary | ICD-10-CM | POA: Diagnosis not present

## 2020-03-01 DIAGNOSIS — Z794 Long term (current) use of insulin: Secondary | ICD-10-CM | POA: Diagnosis not present

## 2020-03-01 DIAGNOSIS — E782 Mixed hyperlipidemia: Secondary | ICD-10-CM | POA: Diagnosis not present

## 2020-03-01 DIAGNOSIS — E039 Hypothyroidism, unspecified: Secondary | ICD-10-CM | POA: Diagnosis not present

## 2020-03-01 DIAGNOSIS — G4700 Insomnia, unspecified: Secondary | ICD-10-CM | POA: Diagnosis not present

## 2020-03-01 DIAGNOSIS — E1159 Type 2 diabetes mellitus with other circulatory complications: Secondary | ICD-10-CM | POA: Diagnosis not present

## 2020-03-01 DIAGNOSIS — I10 Essential (primary) hypertension: Secondary | ICD-10-CM | POA: Diagnosis not present

## 2020-10-09 DIAGNOSIS — I10 Essential (primary) hypertension: Secondary | ICD-10-CM | POA: Diagnosis not present

## 2020-10-09 DIAGNOSIS — Z7984 Long term (current) use of oral hypoglycemic drugs: Secondary | ICD-10-CM | POA: Diagnosis not present

## 2020-10-09 DIAGNOSIS — E039 Hypothyroidism, unspecified: Secondary | ICD-10-CM | POA: Diagnosis not present

## 2020-10-09 DIAGNOSIS — N529 Male erectile dysfunction, unspecified: Secondary | ICD-10-CM | POA: Diagnosis not present

## 2020-10-09 DIAGNOSIS — G4700 Insomnia, unspecified: Secondary | ICD-10-CM | POA: Diagnosis not present

## 2020-10-09 DIAGNOSIS — E1159 Type 2 diabetes mellitus with other circulatory complications: Secondary | ICD-10-CM | POA: Diagnosis not present

## 2020-10-09 DIAGNOSIS — Z0000 Encounter for general adult medical examination without abnormal findings: Secondary | ICD-10-CM | POA: Diagnosis not present

## 2020-10-09 DIAGNOSIS — E782 Mixed hyperlipidemia: Secondary | ICD-10-CM | POA: Diagnosis not present

## 2020-10-16 DIAGNOSIS — Z1211 Encounter for screening for malignant neoplasm of colon: Secondary | ICD-10-CM | POA: Diagnosis not present

## 2021-05-01 DIAGNOSIS — I10 Essential (primary) hypertension: Secondary | ICD-10-CM | POA: Diagnosis not present

## 2021-05-01 DIAGNOSIS — Z23 Encounter for immunization: Secondary | ICD-10-CM | POA: Diagnosis not present

## 2021-05-01 DIAGNOSIS — N529 Male erectile dysfunction, unspecified: Secondary | ICD-10-CM | POA: Diagnosis not present

## 2021-05-01 DIAGNOSIS — E039 Hypothyroidism, unspecified: Secondary | ICD-10-CM | POA: Diagnosis not present

## 2021-05-01 DIAGNOSIS — Z7984 Long term (current) use of oral hypoglycemic drugs: Secondary | ICD-10-CM | POA: Diagnosis not present

## 2021-05-01 DIAGNOSIS — E782 Mixed hyperlipidemia: Secondary | ICD-10-CM | POA: Diagnosis not present

## 2021-05-01 DIAGNOSIS — E1159 Type 2 diabetes mellitus with other circulatory complications: Secondary | ICD-10-CM | POA: Diagnosis not present

## 2021-11-28 DIAGNOSIS — E782 Mixed hyperlipidemia: Secondary | ICD-10-CM | POA: Diagnosis not present

## 2021-11-28 DIAGNOSIS — R972 Elevated prostate specific antigen [PSA]: Secondary | ICD-10-CM | POA: Diagnosis not present

## 2021-11-28 DIAGNOSIS — F439 Reaction to severe stress, unspecified: Secondary | ICD-10-CM | POA: Diagnosis not present

## 2021-11-28 DIAGNOSIS — E039 Hypothyroidism, unspecified: Secondary | ICD-10-CM | POA: Diagnosis not present

## 2021-11-28 DIAGNOSIS — E119 Type 2 diabetes mellitus without complications: Secondary | ICD-10-CM | POA: Diagnosis not present

## 2021-11-28 DIAGNOSIS — G4700 Insomnia, unspecified: Secondary | ICD-10-CM | POA: Diagnosis not present

## 2021-11-28 DIAGNOSIS — I10 Essential (primary) hypertension: Secondary | ICD-10-CM | POA: Diagnosis not present

## 2021-11-28 DIAGNOSIS — Z0000 Encounter for general adult medical examination without abnormal findings: Secondary | ICD-10-CM | POA: Diagnosis not present

## 2023-07-21 ENCOUNTER — Other Ambulatory Visit: Payer: Self-pay | Admitting: Family Medicine

## 2023-07-21 DIAGNOSIS — R0602 Shortness of breath: Secondary | ICD-10-CM

## 2023-08-27 ENCOUNTER — Other Ambulatory Visit: Payer: Medicare HMO
